# Patient Record
Sex: Male | Born: 1980 | Race: White | Hispanic: No | Marital: Married | Smoking: Former smoker
Health system: Southern US, Community
[De-identification: ages and names within clinical notes are randomized; demographics above are authoritative.]

## PROBLEM LIST (undated history)

## (undated) DIAGNOSIS — J45909 Unspecified asthma, uncomplicated: Secondary | ICD-10-CM

## (undated) DIAGNOSIS — T7840XA Allergy, unspecified, initial encounter: Secondary | ICD-10-CM

## (undated) HISTORY — PX: WRIST SURGERY: SHX841

## (undated) HISTORY — DX: Allergy, unspecified, initial encounter: T78.40XA

## (undated) HISTORY — PX: COLONOSCOPY: SHX174

## (undated) HISTORY — PX: UPPER GASTROINTESTINAL ENDOSCOPY: SHX188

## (undated) HISTORY — PX: SHOULDER SURGERY: SHX246

## (undated) HISTORY — DX: Unspecified asthma, uncomplicated: J45.909

---

## 2000-05-20 ENCOUNTER — Encounter: Admission: RE | Admit: 2000-05-20 | Discharge: 2000-05-20 | Payer: Self-pay

## 2007-06-26 ENCOUNTER — Ambulatory Visit (HOSPITAL_COMMUNITY): Admission: RE | Admit: 2007-06-26 | Discharge: 2007-06-26 | Payer: Self-pay | Admitting: Emergency Medicine

## 2007-06-26 ENCOUNTER — Emergency Department (HOSPITAL_COMMUNITY): Admission: EM | Admit: 2007-06-26 | Discharge: 2007-06-26 | Payer: Self-pay | Admitting: Emergency Medicine

## 2008-06-30 IMAGING — CT CT PELVIS W/O CM
2 of 4 series · 17 of 46 positions shown, 19 images · IV contrast (agent unspecified)
Comparison: none

CLINICAL DATA: Left flank pain. History of stones.
 ABDOMEN CT WITHOUT CONTRAST:
TECHNIQUE: Multidetector CT imaging of the abdomen was performed following the standard protocol without IV contrast.
TECHNIQUE: Multidetector CT imaging of the pelvis was performed following the standard protocol without IV contrast.

[Series 2: >200 +reformat 5.0 b31f st · axial · 0.69mm/px · z∈[-522,-88]mm · 14 of 95 slices shown, 16 images]
[im 4/95  soft-tissue]
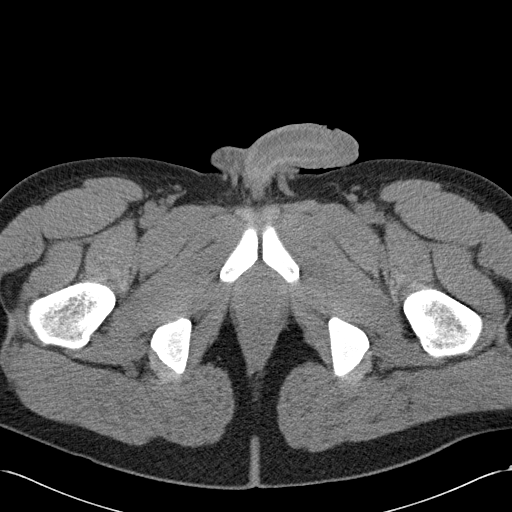
[im 4/95  bone]
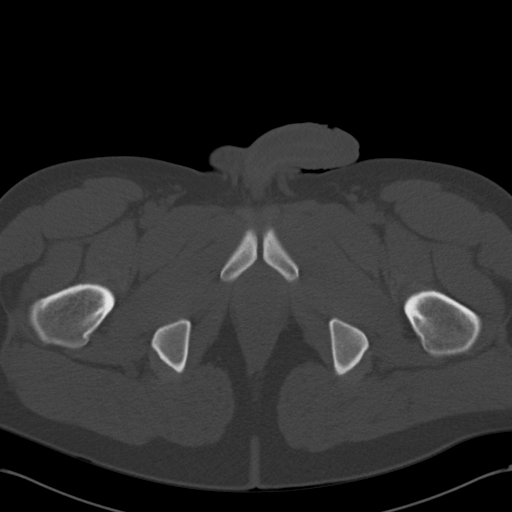
[im 12/95  soft-tissue]
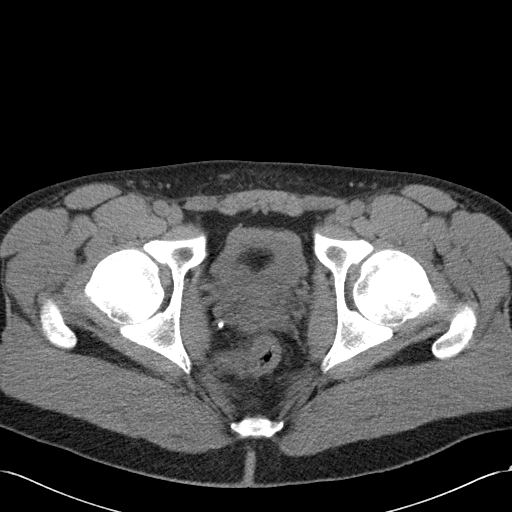
[im 19/95  soft-tissue]
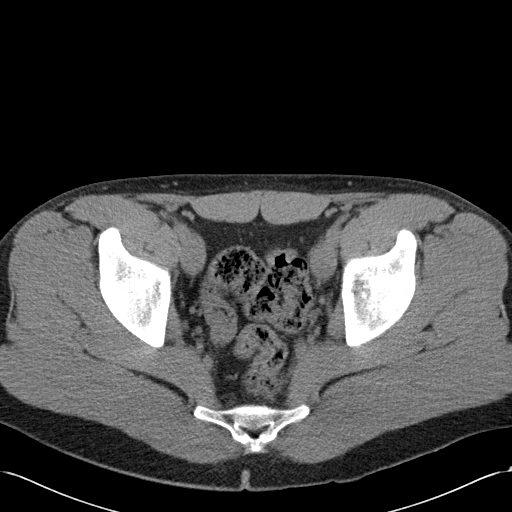
[im 27/95  soft-tissue]
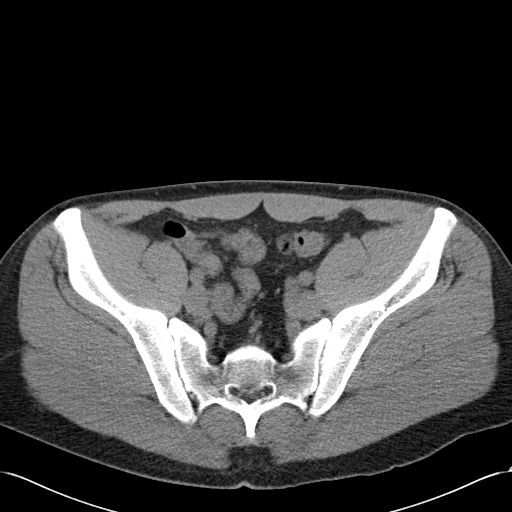
[im 31/95  soft-tissue]
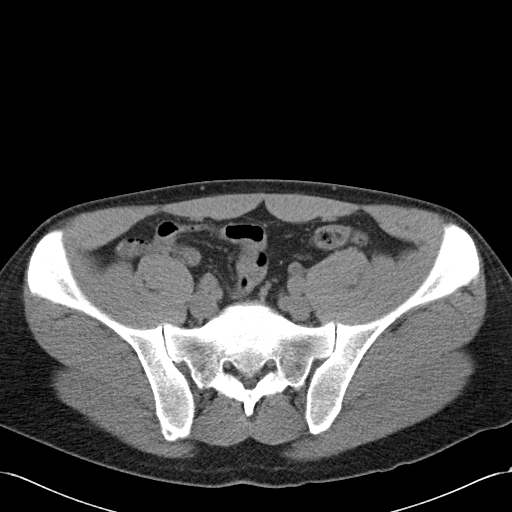
[im 38/95  soft-tissue]
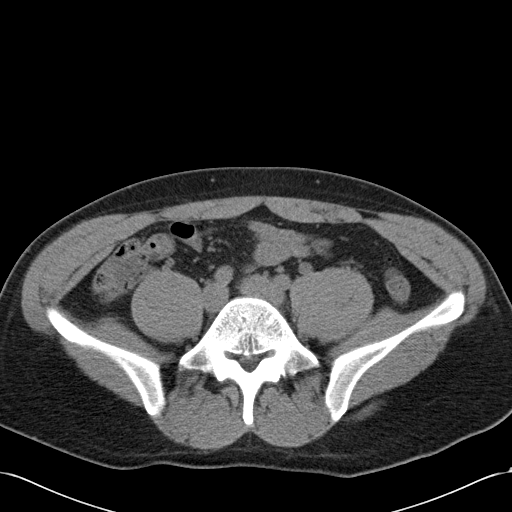
[im 46/95  soft-tissue]
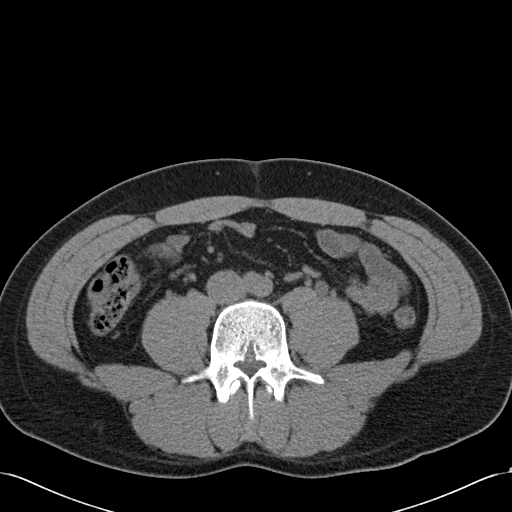
[im 49/95  soft-tissue]
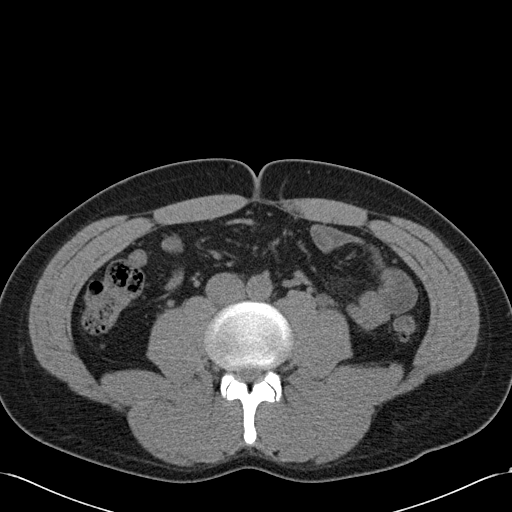
[im 57/95  soft-tissue]
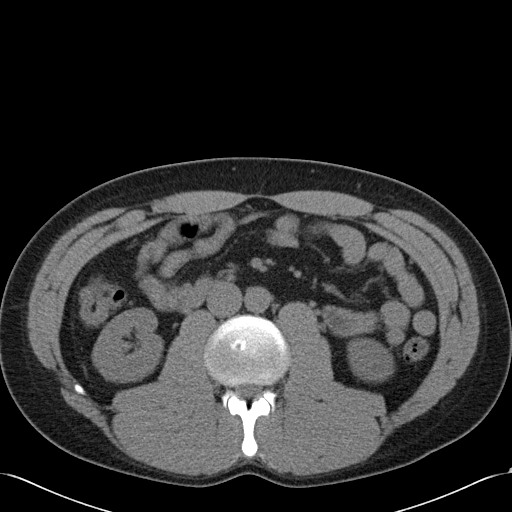
[im 57/95  bone]
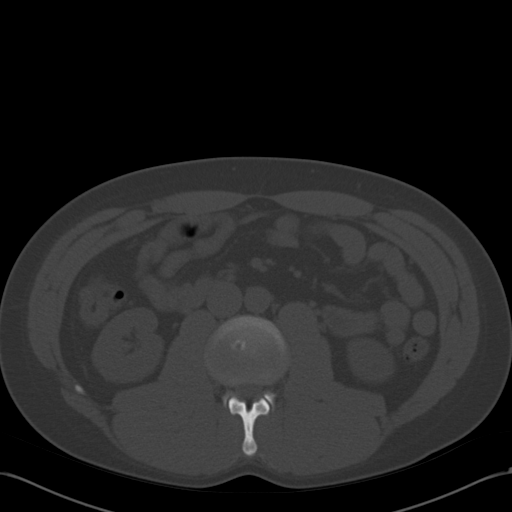
[im 64/95  soft-tissue]
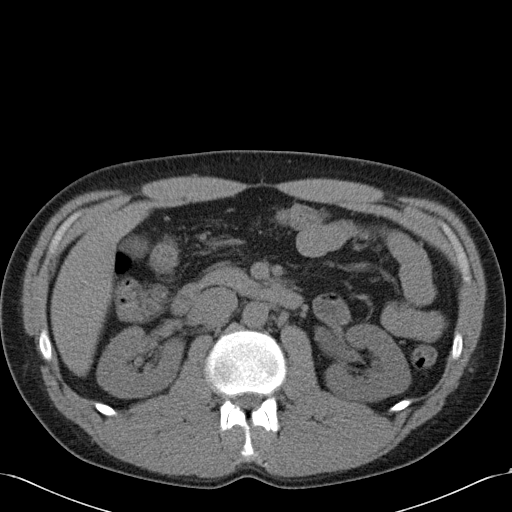
[im 72/95  soft-tissue]
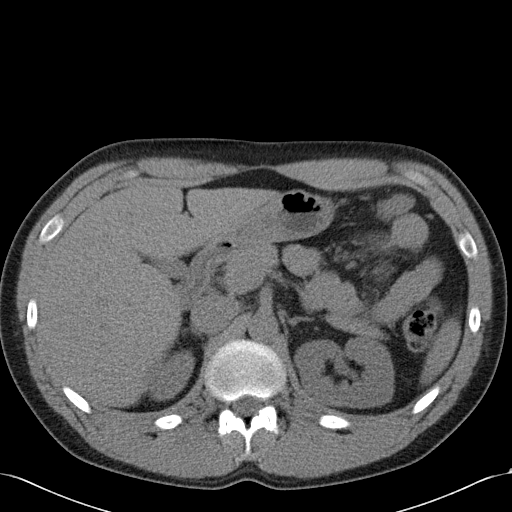
[im 76/95  soft-tissue]
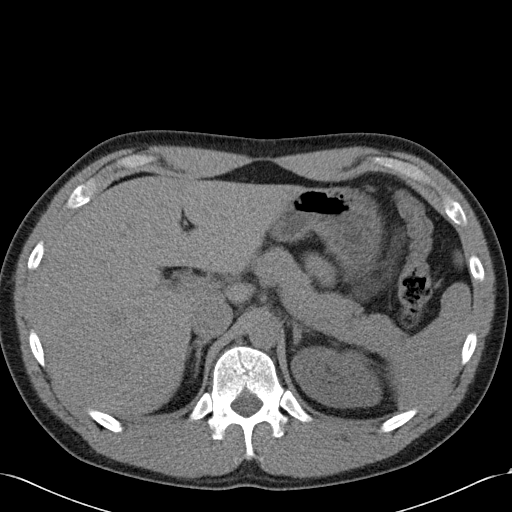
[im 83/95  soft-tissue]
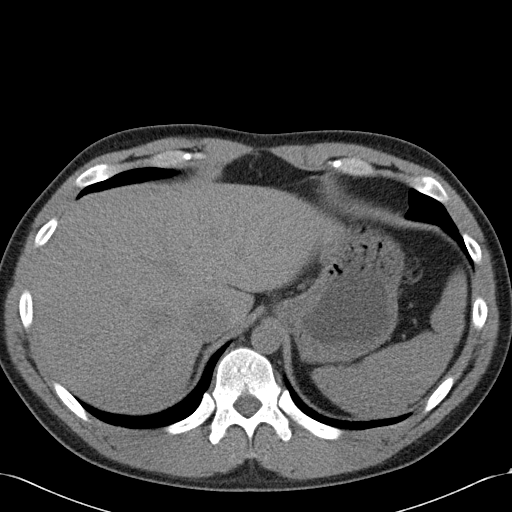
[im 91/95  soft-tissue]
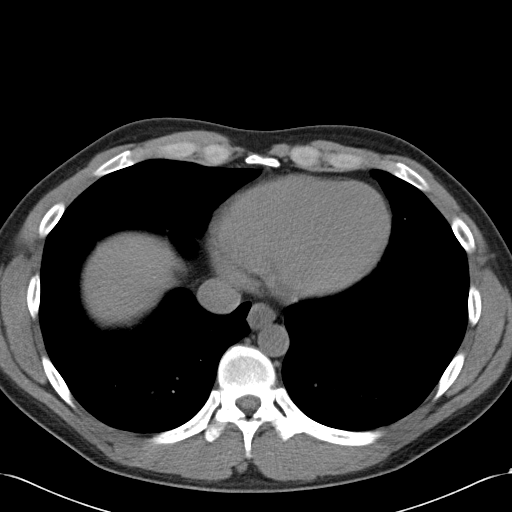

[Series 5: >200 +reformat 2.0 spo cor st · coronal · 0.92mm/px · 3 of 113 slices shown]
[im 38/113  soft-tissue]
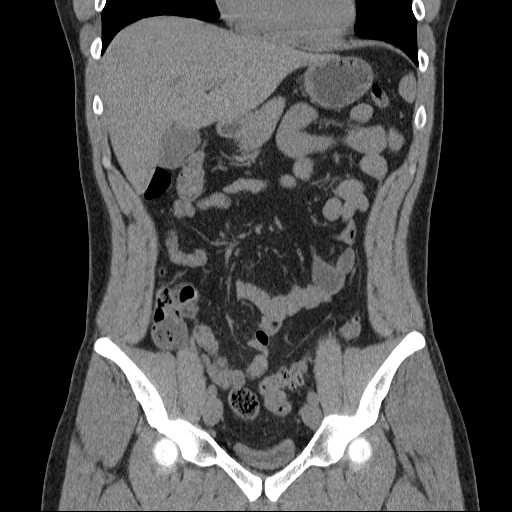
[im 50/113  soft-tissue]
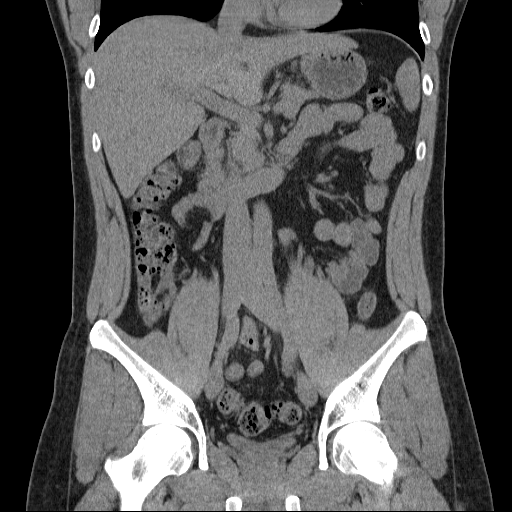
[im 63/113  soft-tissue]
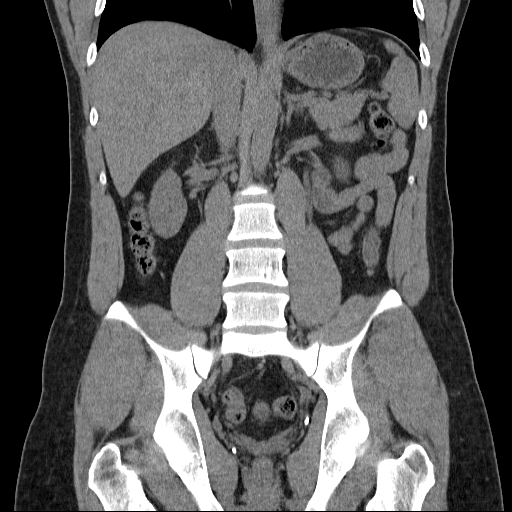

[17 of 46 positions shown; findings below may reference images not displayed]

FINDINGS: The lung bases are clear. No renal calculi are seen. There is, however, fullness of the left pelvicaliceal system and left ureter and CT of the pelvis will be performed. 
 The remainder of the study shows the liver to appear normal on this unenhanced study. No calcified gallstones are seen. The pancreas is normal and the pancreatic duct is not dilated. The adrenal glands and spleen appear normal. The abdominal aorta is normal in caliber.
IMPRESSION: 1.  Mild left hydronephrosis and hydroureter. CT of the pelvis to be performed.
 2.  No renal calculi are noted.
 PELVIS CT WITHOUT CONTRAST:
FINDINGS: The left ureter remains dilated to a point just proximal to the left UV junction where there is obstruction by a 5 x 2 mm distal left ureteral calculus. The urinary bladder is decompressed. The right ureter is normal. No fluid is seen within the pelvis. The appendix is well seen and appears normal.
IMPRESSION: Mild left hydronephrosis and hydroureter caused by 5 x 2 mm distal left ureteral calculus just proximal to the left UV junction.

## 2011-07-26 LAB — POCT URINALYSIS DIP (DEVICE)
Glucose, UA: NEGATIVE
Ketones, ur: 15 — AB
Nitrite: NEGATIVE
Operator id: 247071
Protein, ur: 30 — AB
Specific Gravity, Urine: 1.02
Urobilinogen, UA: 0.2
pH: 6.5

## 2011-12-02 ENCOUNTER — Encounter: Payer: Self-pay | Admitting: Medical

## 2017-01-01 ENCOUNTER — Encounter: Payer: Self-pay | Admitting: Allergy and Immunology

## 2017-01-01 ENCOUNTER — Ambulatory Visit (INDEPENDENT_AMBULATORY_CARE_PROVIDER_SITE_OTHER): Payer: BC Managed Care – PPO | Admitting: Allergy and Immunology

## 2017-01-01 VITALS — BP 140/90 | HR 60 | Temp 98.2°F | Resp 16 | Ht 73.6 in | Wt 261.0 lb

## 2017-01-01 DIAGNOSIS — T782XXA Anaphylactic shock, unspecified, initial encounter: Secondary | ICD-10-CM

## 2017-01-01 NOTE — Patient Instructions (Addendum)
  1. Allergen avoidance measures  2. Blood - alpha gal, CBC w/diff   3. EpiPen, Benadryl, M.D./ER for allergic reaction  4. Will require further evaluation if recurrent episodes.

## 2017-01-01 NOTE — Progress Notes (Signed)
Dear Dr. Manson PasseyBrown,  Thank you for referring Kevin Malone to [facility] on 01/01/2017.   Below is a summation of this patient's evaluation and recommendations.  Thank you for your referral. I will keep you informed about this patient's response to treatment.   If you have any questions please do not hesitate to contact me.   Sincerely,  Kevin PriestEric J. Aneth Schlagel, MD Allergy / Immunology [facility]   ______________________________________________________________________    NEW PATIENT NOTE  Referring Provider: Gillis Malone, Robert, MD Primary Provider: No PCP Per Patient Date of office visit: 01/01/2017    Subjective:   Chief Complaint:  Kevin Malone (DOB: 1981/06/20) is a 36 y.o. male who presents to the clinic on 01/01/2017 with a chief complaint of Allergic Reaction (Hives-itching-throat felt tight) .     HPI:  Kevin Malone presents to this clinic in evaluation of an allergic reaction that occurred 7 days ago. He woke up around 6 AM in the morning with stomach upset described as diarrhea and some cramping. About 8:00 or so he vomited. Around the same point in time he developed intense itching and he took 2 Benadryl and then developed diffuse urticaria and throat tightness. He went to the urgent care center where uvular swelling was identified and he was treated with epinephrine and steroids and Benadryl. After therapy was better within 2 hours and all his symptoms have basically abated sometime in the afternoon of that day.  There was no obvious provoking factor giving rise to this reaction. He did not take any unusual medications or have any unusual environmental exposure. He did not have a fever and he did not have any type of infectious disease within 2 weeks of this reaction. He did consume spaghetti with meat sauce the previous night around 6 PM. He has been mammal consumption free since that  event.  Past Medical History:  Diagnosis Date  . Allergic reaction     Past Surgical History:  Procedure Laterality Date  . SHOULDER SURGERY Left     Allergies as of 01/01/2017   No Known Allergies     Medication List      EPIPEN 2-PAK 0.3 mg/0.3 mL Soaj injection Generic drug:  EPINEPHrine See admin instructions.   predniSONE 10 MG (48) Tbpk tablet Commonly known as:  STERAPRED UNI-PAK 48 TAB TAKE AS DIRECTED FOR 12 DAYS   ranitidine 150 MG tablet Commonly known as:  ZANTAC Take 150 mg by mouth 2 (two) times daily.       Review of systems negative except as noted in HPI / PMHx or noted below:  Review of Systems  Constitutional: Negative.   HENT: Negative.   Eyes: Negative.   Respiratory: Negative.   Cardiovascular: Negative.   Gastrointestinal: Negative.   Genitourinary: Negative.   Musculoskeletal: Negative.   Skin: Negative.   Neurological: Negative.   Endo/Heme/Allergies: Negative.        Early childhood allergic rhinitis not requiring therapy in years  Psychiatric/Behavioral: Negative.     Family History  Problem Relation Age of Onset  . Hypertension Mother   . Hypertension Father     Social History   Social History  . Marital status: Single    Spouse name: N/A  . Number of children: N/A  . Years of education: N/A   Occupational History  . Not on file.   Social History Main Topics  . Smoking status:  Former Smoker  . Smokeless tobacco: Current User    Types: Chew  . Alcohol use Not on file  . Drug use: Unknown  . Sexual activity: Not on file   Other Topics Concern  . Not on file   Social History Narrative  . No narrative on file    Environmental and Social history  Lives in a house with a dry environment, a dog located inside the household, carpeting in the bedroom, no plastic on the bed or pillow, no smokers located inside the household. He is a Runner, broadcasting/film/video.  Objective:   Vitals:   01/01/17 1332  BP: 140/90  Pulse: 60   Resp: 16  Temp: 98.2 F (36.8 C)   Height: 6' 1.6" (186.9 cm) Weight: 261 lb (118.4 kg)  Physical Exam  Constitutional: He is well-developed, well-nourished, and in no distress.  HENT:  Head: Normocephalic. Head is without right periorbital erythema and without left periorbital erythema.  Right Ear: Tympanic membrane, external ear and ear canal normal.  Left Ear: Tympanic membrane, external ear and ear canal normal.  Nose: Nose normal. No mucosal edema or rhinorrhea.  Mouth/Throat: Oropharynx is clear and moist and mucous membranes are normal. No oropharyngeal exudate.  Eyes: Conjunctivae and lids are normal. Pupils are equal, round, and reactive to light.  Neck: Trachea normal. No tracheal deviation present. No thyromegaly present.  Cardiovascular: Normal rate, regular rhythm, S1 normal, S2 normal and normal heart sounds.   No murmur heard. Pulmonary/Chest: Effort normal. No stridor. No tachypnea. No respiratory distress. He has no wheezes. He has no rales. He exhibits no tenderness.  Abdominal: Soft. He exhibits no distension and no mass. There is no hepatosplenomegaly. There is no tenderness. There is no rebound and no guarding.  Musculoskeletal: He exhibits no edema or tenderness.  Lymphadenopathy:       Head (right side): No tonsillar adenopathy present.       Head (left side): No tonsillar adenopathy present.    He has no cervical adenopathy.    He has no axillary adenopathy.  Neurological: He is alert. Gait normal.  Skin: No rash noted. He is not diaphoretic. No erythema. No pallor. Nails show no clubbing.  Psychiatric: Mood and affect normal.    Diagnostics: Allergy skin tests were performed. He did not demonstrate any hypersensitivity against a screening panel of foods.  Assessment and Plan:    1. Anaphylaxis, initial encounter     1. Allergen avoidance measures  2. Blood - alpha gal, CBC w/diff   3. EpiPen, Benadryl, M.D./ER for allergic reaction  4. Will  require further evaluation if recurrent episodes.  The etiology of [name]' episode of anaphylaxis last week is unknown. We will rule out alpha gal syndrome with the blood tests mentioned above and consider further evaluation for other etiologic agents if he has recurrent reactions as he moves forward. Hopefully this was an isolated event and will not be recurrent if we cannot find the culprit responsible for this immunological hyperreactivity. He will contact me should he develop another reaction in the future. I'll contact him with the results of his blood tests when they are available for review.  Kevin Priest, MD  [facility]

## 2017-01-07 LAB — ALPHA-GAL PANEL
Alpha Gal IgE*: 8.1 kU/L — ABNORMAL HIGH (ref <0.35)
BEEF (BOS SPP) IGE: 2.26 kU/L — AB (ref <0.35)
BEEF CLASS INTERPRETATION: 2
LAMB/MUTTON (OVIS SPP) IGE: 0.34 kU/L (ref <0.35)
PORK CLASS INTERPRETATION: 2
Pork (Sus spp) IgE: 1.36 kU/L — ABNORMAL HIGH (ref <0.35)

## 2017-01-07 LAB — CBC WITH DIFFERENTIAL/PLATELET
BASOS: 0 %
Basophils Absolute: 0 10*3/uL (ref 0.0–0.2)
EOS (ABSOLUTE): 0 10*3/uL (ref 0.0–0.4)
Eos: 0 %
Hematocrit: 45.6 % (ref 37.5–51.0)
Hemoglobin: 15.6 g/dL (ref 13.0–17.7)
IMMATURE GRANS (ABS): 0.1 10*3/uL (ref 0.0–0.1)
IMMATURE GRANULOCYTES: 1 %
LYMPHS: 10 %
Lymphocytes Absolute: 1.6 10*3/uL (ref 0.7–3.1)
MCH: 30.4 pg (ref 26.6–33.0)
MCHC: 34.2 g/dL (ref 31.5–35.7)
MCV: 89 fL (ref 79–97)
MONOCYTES: 7 %
Monocytes Absolute: 1.1 10*3/uL — ABNORMAL HIGH (ref 0.1–0.9)
NEUTROS ABS: 13.3 10*3/uL — AB (ref 1.4–7.0)
Neutrophils: 82 %
PLATELETS: 363 10*3/uL (ref 150–379)
RBC: 5.14 x10E6/uL (ref 4.14–5.80)
RDW: 13.7 % (ref 12.3–15.4)
WBC: 16.1 10*3/uL — ABNORMAL HIGH (ref 3.4–10.8)

## 2017-12-25 ENCOUNTER — Encounter: Payer: Self-pay | Admitting: Allergy and Immunology

## 2017-12-25 ENCOUNTER — Ambulatory Visit: Payer: BC Managed Care – PPO | Admitting: Allergy and Immunology

## 2017-12-25 VITALS — BP 110/70 | HR 88 | Resp 18

## 2017-12-25 DIAGNOSIS — T7800XD Anaphylactic reaction due to unspecified food, subsequent encounter: Secondary | ICD-10-CM

## 2017-12-25 MED ORDER — AUVI-Q 0.3 MG/0.3ML IJ SOAJ: INTRAMUSCULAR | 3 refills | Status: AC

## 2017-12-25 NOTE — Patient Instructions (Signed)
  1. Allergen avoidance measures  2. Blood - alpha gal   3. AUVI-Q 0.3 (Epi-Pen), Benadryl, M.D./ER for allergic reaction  4. Restart consuming mammal?

## 2017-12-25 NOTE — Progress Notes (Signed)
Follow-up Note  Referring Provider: No ref. provider found Primary Provider: Patient, No Pcp Per Date of Office Visit: 12/25/2017  Subjective:   Kevin Malone (DOB: April 23, 1981) is a 37 y.o. male who [facility] on 12/25/2017 in re-evaluation of the following:  HPI: Kevin Malone returns to this clinic in evaluation of his alpha gal syndrome.  His last visit to this clinic was 01 January 2017 at which point in time we diagnosed him with sensitivity to mammal.  He has been mammal free and has not had any reactions.  He is very interested in restarting consumption of mammal.  Allergies as of 12/25/2017   No Known Allergies     Medication List      EPIPEN 2-PAK 0.3 mg/0.3 mL Soaj injection Generic drug:  EPINEPHrine See admin instructions.       Past Medical History:  Diagnosis Date  . Allergic reaction     Past Surgical History:  Procedure Laterality Date  . SHOULDER SURGERY Left     Review of systems negative except as noted in HPI / PMHx or noted below:  Review of Systems  Constitutional: Negative.   HENT: Negative.   Eyes: Negative.   Respiratory: Negative.   Cardiovascular: Negative.   Gastrointestinal: Negative.   Genitourinary: Negative.   Musculoskeletal: Negative.   Skin: Negative.   Neurological: Negative.   Endo/Heme/Allergies: Negative.   Psychiatric/Behavioral: Negative.      Objective:   Vitals:   12/25/17 1028  BP: 110/70  Pulse: 88  Resp: 18          Physical Exam  Constitutional: He is well-developed, well-nourished, and in no distress.  HENT:  Head: Normocephalic.  Right Ear: Tympanic membrane, external ear and ear canal normal.  Left Ear: Tympanic membrane, external ear and ear canal normal.  Nose: Nose normal. No mucosal edema or rhinorrhea.  Mouth/Throat: Uvula is midline, oropharynx is clear and moist and mucous membranes are normal. No oropharyngeal exudate.  Eyes: Conjunctivae are normal.  Neck:  Trachea normal. No tracheal tenderness present. No tracheal deviation present. No thyromegaly present.  Cardiovascular: Normal rate, regular rhythm, S1 normal, S2 normal and normal heart sounds.  No murmur heard. Pulmonary/Chest: Breath sounds normal. No stridor. No respiratory distress. He has no wheezes. He has no rales.  Musculoskeletal: He exhibits no edema.  Lymphadenopathy:       Head (right side): No tonsillar adenopathy present.       Head (left side): No tonsillar adenopathy present.    He has no cervical adenopathy.  Neurological: He is alert. Gait normal.  Skin: No rash noted. He is not diaphoretic. No erythema. Nails show no clubbing.  Psychiatric: Mood and affect normal.    Diagnostics:    Results of blood tests obtained 01 January 2017 identified an alpha gal IgE titer of 8.0 KU/L, beef IgE 2.26 KU/L, pork 1.36 KU/L, lamb 0.34 KU/L.  WBC was 16.1, absolute eosinophils 0, absolute lymphocyte 1600, hemoglobin 15.6, platelet 363.  Assessment and Plan:   1. Anaphylactic shock due to food, subsequent encounter     1. Allergen avoidance measures  2. Blood - alpha gal   3. AUVI-Q 0.3 (Epi-Pen), Benadryl, M.D./ER for allergic reaction  4. Restart consuming mammal?  Kevin Malone appears to be doing quite well while he is mammal consumption free.  We will now see if there is an opportunity for him to restart consumption of mammal by rechecking his alpha gal panel.  I will contact him with the results of that test once it is available for review.  Laurette Schimke, MD Allergy / Immunology [facility]

## 2017-12-29 ENCOUNTER — Encounter: Payer: Self-pay | Admitting: Allergy and Immunology

## 2017-12-30 LAB — ALPHA-GAL PANEL
ALPHA GAL IGE: 2.61 kU/L — AB (ref <0.10)
BEEF (BOS SPP) IGE: 1.03 kU/L — AB (ref <0.35)
Class Interpretation: 1
Class Interpretation: 2
Lamb/Mutton (Ovis spp) IgE: 0.16 kU/L (ref <0.35)
Pork (Sus spp) IgE: 0.4 kU/L — ABNORMAL HIGH (ref <0.35)

## 2018-11-25 ENCOUNTER — Telehealth: Payer: Self-pay | Admitting: Allergy and Immunology

## 2018-11-25 ENCOUNTER — Other Ambulatory Visit: Payer: Self-pay | Admitting: *Deleted

## 2018-11-25 DIAGNOSIS — T7800XD Anaphylactic reaction due to unspecified food, subsequent encounter: Secondary | ICD-10-CM

## 2018-11-25 NOTE — Telephone Encounter (Signed)
Patient has upcoming yearly appt for alpha-gal Patient wants to know where he should go for testing prior to appt Please call

## 2018-11-25 NOTE — Telephone Encounter (Signed)
Please obtain alpha gal panel

## 2018-11-25 NOTE — Telephone Encounter (Signed)
Do you want [place] to wait to order labs until his appt or do prior to him coming in?

## 2018-11-25 NOTE — Telephone Encounter (Signed)
Spoke with [name], I will put orders in and he will go to [facility].

## 2018-12-03 ENCOUNTER — Ambulatory Visit: Payer: BC Managed Care – PPO | Admitting: Allergy and Immunology

## 2018-12-03 ENCOUNTER — Encounter: Payer: Self-pay | Admitting: Allergy and Immunology

## 2018-12-03 VITALS — BP 118/80 | HR 84 | Resp 18

## 2018-12-03 DIAGNOSIS — T7800XD Anaphylactic reaction due to unspecified food, subsequent encounter: Secondary | ICD-10-CM

## 2018-12-03 LAB — ALPHA-GAL PANEL
Alpha Gal IgE*: 8.74 kU/L — ABNORMAL HIGH (ref <0.10)
BEEF CLASS INTERPRETATION: 2
Beef (Bos spp) IgE: 3.15 kU/L — ABNORMAL HIGH (ref <0.35)
Lamb/Mutton (Ovis spp) IgE: 0.29 kU/L (ref <0.35)
PORK (SUS SPP) IGE: 1.19 kU/L — AB (ref <0.35)
PORK CLASS INTERPRETATION: 2

## 2018-12-03 NOTE — Patient Instructions (Addendum)
  1. Allergen avoidance measures  2. AUVI-Q 0.3, Benadryl, M.D./ER for allergic reaction  3.  Return to clinic in 1 year or earlier if problem

## 2018-12-03 NOTE — Progress Notes (Signed)
Follow-up Note  Referring Provider: No ref. provider found Primary Provider: Patient, No Pcp Per Date of Office Visit: 12/03/2018  Subjective:   Kevin Malone (DOB: 1981/02/25) is a 38 y.o. male who [facility] on 12/03/2018 in re-evaluation of the following:  HPI: Kevin Malone returns to this clinic in reevaluation of alpha gal syndrome.  I last saw him in his clinic on 25 December 2017.  He has remained mammal free and has not had any allergic reactions.  He does have an injectable epinephrine device.  Allergies as of 12/03/2018   No Known Allergies     Medication List      AUVI-Q 0.3 mg/0.3 mL Soaj injection Generic drug:  EPINEPHrine Use as directed for life threatening allergic reactions       Past Medical History:  Diagnosis Date  . Allergic reaction     Past Surgical History:  Procedure Laterality Date  . SHOULDER SURGERY Left     Review of systems negative except as noted in HPI / PMHx or noted below:  Review of Systems  Constitutional: Negative.   HENT: Negative.   Eyes: Negative.   Respiratory: Negative.   Cardiovascular: Negative.   Gastrointestinal: Negative.   Genitourinary: Negative.   Musculoskeletal: Negative.   Skin: Negative.   Neurological: Negative.   Endo/Heme/Allergies: Negative.   Psychiatric/Behavioral: Negative.      Objective:   Vitals:   12/03/18 1116  BP: 118/80  Pulse: 84  Resp: 18  SpO2: 98%          Physical Exam Constitutional:      Appearance: He is not diaphoretic.  HENT:     Head: Normocephalic.     Right Ear: Tympanic membrane, ear canal and external ear normal.     Left Ear: Tympanic membrane, ear canal and external ear normal.     Nose: Nose normal. No mucosal edema or rhinorrhea.     Mouth/Throat:     Pharynx: Uvula midline. No oropharyngeal exudate.  Eyes:     Conjunctiva/sclera: Conjunctivae normal.  Neck:     Thyroid: No thyromegaly.     Trachea: Trachea normal. No  tracheal tenderness or tracheal deviation.  Cardiovascular:     Rate and Rhythm: Normal rate and regular rhythm.     Heart sounds: Normal heart sounds, S1 normal and S2 normal. No murmur.  Pulmonary:     Effort: No respiratory distress.     Breath sounds: Normal breath sounds. No stridor. No wheezing or rales.  Lymphadenopathy:     Head:     Right side of head: No tonsillar adenopathy.     Left side of head: No tonsillar adenopathy.     Cervical: No cervical adenopathy.  Skin:    Findings: No erythema or rash.     Nails: There is no clubbing.   Neurological:     Mental Status: He is alert.     Diagnostics: Results of blood tests obtained 27 November 2018 identified alpha gal IgE 8.74 KU/L, beef 3.15 KU/L, pork 1.19 KU/L  Assessment and Plan:   1. Anaphylactic shock due to food, subsequent encounter     1. Allergen avoidance measures  2. AUVI-Q 0.3, Benadryl, M.D./ER for allergic reaction  3.  Return to clinic in 1 year or earlier if problem  Jaydden appears to still have relatively high titer antibodies directed against alpha gal and I have informed him that I do not recommend that he have exposure  to a mammal meat at this point in time.  If he does well I will see him back in his clinic in 1 year or earlier if there is a problem.  It should be noted that he was not particularly happy about remaining with alpha gal syndrome and he did make a request today that he be checked every 6 months to see if it resolves this issue.  Laurette Schimke, MD Allergy / Immunology [facility]

## 2018-12-07 ENCOUNTER — Encounter: Payer: Self-pay | Admitting: Allergy and Immunology

## 2023-09-28 ENCOUNTER — Emergency Department (HOSPITAL_BASED_OUTPATIENT_CLINIC_OR_DEPARTMENT_OTHER): Payer: BC Managed Care – PPO

## 2023-09-28 ENCOUNTER — Other Ambulatory Visit: Payer: Self-pay

## 2023-09-28 ENCOUNTER — Emergency Department (HOSPITAL_BASED_OUTPATIENT_CLINIC_OR_DEPARTMENT_OTHER)
Admission: EM | Admit: 2023-09-28 | Discharge: 2023-09-28 | Disposition: A | Discharge status: Home / Self Care | Payer: BC Managed Care – PPO | Attending: Emergency Medicine | Admitting: Emergency Medicine

## 2023-09-28 DIAGNOSIS — D72829 Elevated white blood cell count, unspecified: Secondary | ICD-10-CM | POA: Diagnosis not present

## 2023-09-28 DIAGNOSIS — R1032 Left lower quadrant pain: Secondary | ICD-10-CM | POA: Diagnosis present

## 2023-09-28 DIAGNOSIS — K5792 Diverticulitis of intestine, part unspecified, without perforation or abscess without bleeding: Secondary | ICD-10-CM | POA: Insufficient documentation

## 2023-09-28 LAB — COMPREHENSIVE METABOLIC PANEL
ALT: 28 U/L (ref 0–44)
AST: 21 U/L (ref 15–41)
Albumin: 4.8 g/dL (ref 3.5–5.0)
Alkaline Phosphatase: 52 U/L (ref 38–126)
Anion gap: 8 (ref 5–15)
BUN: 14 mg/dL (ref 6–20)
CO2: 28 mmol/L (ref 22–32)
Calcium: 9.3 mg/dL (ref 8.9–10.3)
Chloride: 102 mmol/L (ref 98–111)
Creatinine, Ser: 1.16 mg/dL (ref 0.61–1.24)
GFR, Estimated: >60 mL/min (ref >60)
Glucose, Bld: 101 mg/dL — ABNORMAL HIGH (ref 70–99)
Potassium: 4 mmol/L (ref 3.5–5.1)
Sodium: 138 mmol/L (ref 135–145)
Total Bilirubin: 1.1 mg/dL (ref <1.2)
Total Protein: 7.4 g/dL (ref 6.5–8.1)

## 2023-09-28 LAB — URINALYSIS, ROUTINE W REFLEX MICROSCOPIC
Bacteria, UA: NONE SEEN
Bilirubin Urine: NEGATIVE
Glucose, UA: NEGATIVE mg/dL
Ketones, ur: 40 mg/dL — AB
Leukocytes,Ua: NEGATIVE
Nitrite: NEGATIVE
Specific Gravity, Urine: 1.031 — ABNORMAL HIGH (ref 1.005–1.030)
pH: 6.5 (ref 5.0–8.0)

## 2023-09-28 LAB — LIPASE, BLOOD: Lipase: 10 U/L — ABNORMAL LOW (ref 11–51)

## 2023-09-28 LAB — CBC
HCT: 42.5 % (ref 39.0–52.0)
Hemoglobin: 14.5 g/dL (ref 13.0–17.0)
MCH: 31.6 pg (ref 26.0–34.0)
MCHC: 34.1 g/dL (ref 30.0–36.0)
MCV: 92.6 fL (ref 80.0–100.0)
Platelets: 313 10*3/uL (ref 150–400)
RBC: 4.59 MIL/uL (ref 4.22–5.81)
RDW: 12.7 % (ref 11.5–15.5)
WBC: 12.3 10*3/uL — ABNORMAL HIGH (ref 4.0–10.5)
nRBC: 0 % (ref 0.0–0.2)

## 2023-09-28 MED ORDER — MORPHINE SULFATE (PF) 4 MG/ML IV SOLN
4.0000 mg | Freq: Once | INTRAVENOUS | Status: AC
  Administered 2023-09-28: 4 mg via INTRAVENOUS
  Filled 2023-09-28: qty 1

## 2023-09-28 MED ORDER — METRONIDAZOLE 500 MG PO TABS
500.0000 mg | ORAL_TABLET | Freq: Three times a day (TID) | ORAL | 0 refills | Status: DC
Start: 2023-09-28 — End: 2024-05-27

## 2023-09-28 MED ORDER — CIPROFLOXACIN HCL 500 MG PO TABS
500.0000 mg | ORAL_TABLET | Freq: Once | ORAL | Status: AC
  Administered 2023-09-28: 500 mg via ORAL
  Filled 2023-09-28: qty 1

## 2023-09-28 MED ORDER — OXYCODONE-ACETAMINOPHEN 5-325 MG PO TABS: 1.0000 | ORAL_TABLET | Freq: Four times a day (QID) | ORAL | 0 refills | Status: DC | PRN

## 2023-09-28 MED ORDER — OXYCODONE-ACETAMINOPHEN 5-325 MG PO TABS
2.0000 | ORAL_TABLET | Freq: Once | ORAL | Status: AC
Start: 2023-09-28 — End: 2023-09-28
  Administered 2023-09-28: 2 via ORAL
  Filled 2023-09-28: qty 2

## 2023-09-28 MED ORDER — METRONIDAZOLE 500 MG PO TABS
500.0000 mg | ORAL_TABLET | Freq: Once | ORAL | Status: AC
  Administered 2023-09-28: 500 mg via ORAL
  Filled 2023-09-28: qty 1

## 2023-09-28 MED ORDER — CIPROFLOXACIN HCL 500 MG PO TABS: 500.0000 mg | ORAL_TABLET | Freq: Two times a day (BID) | ORAL | 0 refills | Status: DC

## 2023-09-28 MED ORDER — IOHEXOL 300 MG/ML  SOLN
100.0000 mL | Freq: Once | INTRAMUSCULAR | Status: AC | PRN
  Administered 2023-09-28: 100 mL via INTRAVENOUS

## 2023-09-28 NOTE — Discharge Instructions (Addendum)
Begin taking Cipro and Flagyl as prescribed.  Begin taking Percocet as prescribed as needed for pain.  Follow-up with your primary doctor in the next 1 to 2 weeks to discuss GI referral.  Return to the ER if you develop worsening pain, high fevers, rectal bleeding, or for other new and concerning symptoms.

## 2023-09-28 NOTE — ED Triage Notes (Signed)
Pt POV from home reporting bilateral upper abd pain, tender to touch, hx diverticulitis, seen at [facility] and told to come to ED for further evaluation.

## 2023-09-28 NOTE — ED Provider Notes (Signed)
[place] EMERGENCY DEPARTMENT AT [facility] Provider Note   CSN: [id number] Arrival date & time: 09/28/23  1711     History  Chief Complaint  Patient presents with   Abdominal Pain    Kevin Malone is a 42 y.o. male.  Patient is a 42 year old male with no significant past medical history.  Patient presenting today with complaints of abdominal pain.  He describes a 1 week history of worsening left lower quadrant pain in the absence of any fevers.  He denies any bloody stools.  No aggravating or alleviating factors.  Patient initially went to urgent care, then was sent here for CT scan to rule out bowel obstruction versus diverticulitis.  The history is provided by the patient.       Home Medications Prior to Admission medications   Medication Sig Start Date End Date Taking? Authorizing Provider  AUVI-Q 0.3 MG/0.3ML SOAJ injection Use as directed for life threatening allergic reactions 12/25/17   Kozlow, Alvira Philips, MD      Allergies    Patient has no known allergies.    Review of Systems   Review of Systems  All other systems reviewed and are negative.   Physical Exam Updated Vital Signs BP 123/86   Pulse 90   Temp 98.9 F (37.2 C) (Oral)   Resp 17   Ht 6\' 2"  (1.88 m)   Wt 136.1 kg   SpO2 98%   BMI 38.52 kg/m  Physical Exam Vitals and nursing note reviewed.  Constitutional:      General: He is not in acute distress.    Appearance: He is well-developed. He is not diaphoretic.  HENT:     Head: Normocephalic and atraumatic.  Cardiovascular:     Rate and Rhythm: Normal rate and regular rhythm.     Heart sounds: No murmur heard.    No friction rub.  Pulmonary:     Effort: Pulmonary effort is normal. No respiratory distress.     Breath sounds: Normal breath sounds. No wheezing or rales.  Abdominal:     General: Bowel sounds are normal. There is no distension.     Palpations: Abdomen is soft.     Tenderness: There is abdominal tenderness in the left  lower quadrant. There is no right CVA tenderness, left CVA tenderness, guarding or rebound.  Musculoskeletal:        General: Normal range of motion.     Cervical back: Normal range of motion and neck supple.  Skin:    General: Skin is warm and dry.  Neurological:     Mental Status: He is alert and oriented to person, place, and time.     Coordination: Coordination normal.     ED Results / Procedures / Treatments   Labs (all labs ordered are listed, but only abnormal results are displayed) Labs Reviewed  LIPASE, BLOOD - Abnormal; Notable for the following components:      Result Value   Lipase 10 (*)    All other components within normal limits  COMPREHENSIVE METABOLIC PANEL - Abnormal; Notable for the following components:   Glucose, Bld 101 (*)    All other components within normal limits  CBC - Abnormal; Notable for the following components:   WBC 12.3 (*)    All other components within normal limits  URINALYSIS, ROUTINE W REFLEX MICROSCOPIC - Abnormal; Notable for the following components:   Specific Gravity, Urine 1.031 (*)    Hgb urine dipstick TRACE (*)  Ketones, ur 40 (*)    Protein, ur TRACE (*)    All other components within normal limits    EKG None  Radiology CT ABDOMEN W CONTRAST Result Date: 09/28/2023 CLINICAL DATA:  Abdominal pain, acute, nonlocalized EXAM: CT ABDOMEN WITH CONTRAST TECHNIQUE: Multidetector CT imaging of the abdomen was performed using the standard protocol following bolus administration of intravenous contrast. RADIATION DOSE REDUCTION: This exam was performed according to the departmental dose-optimization program which includes automated exposure control, adjustment of the mA and/or kV according to patient size and/or use of iterative reconstruction technique. CONTRAST:  OMNIPAQUE IOHEXOL 300 MG/ML  SOLN COMPARISON:  06/26/2007 FINDINGS: Lower chest: No acute abnormality. Hepatobiliary: No focal liver abnormality is seen. No  gallstones, gallbladder wall thickening, or biliary dilatation. Pancreas: Unremarkable. No pancreatic ductal dilatation or surrounding inflammatory changes. Spleen: Normal in size without focal abnormality. Adrenals/Urinary Tract: Unremarkable adrenal glands. Kidneys enhance symmetrically without focal lesion, stone, or hydronephrosis. Ureters are nondilated. Urinary bladder appears unremarkable for the degree of distention. Stomach/Bowel: Stomach within normal limits. No dilated loops of bowel. Normal appendix in the right lower quadrant. Diverticulosis throughout the colon. At least 2 focally inflamed diverticula, one at the distal descending colon and one within the sigmoid colon (series 2, images 74 and 86). Associated pericolonic fat stranding and trace fluid. No organized pericolonic fluid collection. No extraluminal air. Vascular/Lymphatic: No significant vascular findings are present. No enlarged abdominal or pelvic lymph nodes. Other: No ascites or pneumoperitoneum. Musculoskeletal: No acute or significant osseous findings. IMPRESSION: Acute uncomplicated diverticulitis of the distal descending and sigmoid colon. Electronically Signed   By: Duanne Guess D.O.   On: 09/28/2023 18:50    Procedures Procedures    Medications Ordered in ED Medications  morphine (PF) 4 MG/ML injection 4 mg (has no administration in time range)  ciprofloxacin (CIPRO) tablet 500 mg (has no administration in time range)  metroNIDAZOLE (FLAGYL) tablet 500 mg (has no administration in time range)  oxyCODONE-acetaminophen (PERCOCET/ROXICET) 5-325 MG per tablet 2 tablet (has no administration in time range)  iohexol (OMNIPAQUE) 300 MG/ML solution 100 mL (100 mLs Intravenous Contrast Given 09/28/23 1834)    ED Course/ Medical Decision Making/ A&P  Patient presenting with abdominal pain as described in the HPI.  He arrives here with stable vital signs and is afebrile.  Physical examination reveals tenderness in the  left lower quadrant, but is otherwise unremarkable.  Laboratory studies obtained including CBC, CMP, and lipase.  CBC shows a white count of 12.3, but laboratory studies otherwise unremarkable.  CT scan of the abdomen and pelvis shows acute uncomplicated diverticulitis.  Patient given Cipro and Flagyl along with morphine and Percocet for pain.  He will be discharged with Abrol and Flagyl and Percocet.  Patient to follow-up with primary doctor and return if symptoms worsen.  Final Clinical Impression(s) / ED Diagnoses Final diagnoses:  None    Rx / DC Orders ED Discharge Orders     None         Geoffery Lyons, MD 09/28/23 [id number]

## 2023-09-29 NOTE — ED Notes (Signed)
Pt c/o bilat upper abdominal pain, tender to touch.  States he has not been able to eat or drink much today.   Has IV in place  Ct scan shows diverticulitis

## 2024-05-24 ENCOUNTER — Encounter: Payer: Self-pay | Admitting: Physician Assistant

## 2024-05-26 NOTE — Progress Notes (Signed)
 05/27/2024 Kevin Malone [facility] [id number] 1981-03-30  Referring provider: No ref. provider found Primary GI doctor: Dr. San  ASSESSMENT AND PLAN:  Acute diverticulitis Potentially had another episode 2 years prior treated at [facility] without imaging 09/28/2023 CT AP W in ER for abdominal pain showed Acute uncomplicated diverticulitis of the distal descending and sigmoid colon.  Treated with Cipro  Flagyl .  Patient had associated leukocytosis normal kidney liver. Symptoms improved Kevin family history of colon cancer, autoimmune BM daily or 2 x a day, formed stools, rare BRB on TP and outside of stool, last saw weeks ago.  Admits to poor diet, busy with work, has LLQ pain with salads Kevin ongoing symptoms related to diverticulitis.  -Reviewed recommendations to follow a high fiber diet or to use fiber supplements on a regular basis.   -Using NSAIDs may be associated with a moderately increased risk of occurrence of any episode of diverticulitis and complicated diverticulitis.  -Will schedule for colonoscopy to evaluate and rule out malignancy/autoimmune, We have discussed the risks of bleeding, infection, perforation, medication reactions, and remote risk of death associated with colonoscopy. All questions were answered and the patient acknowledges these risk and wishes to proceed.  Dysphagia for years, denies GERD, has more mucus, hoarseness Asthma as a kid, allergies, but eczema Upper esophagus symptoms with regurgitation with water Worsening symptoms 2 years ago Kevin NSAIDS, rare ETOH 1-2 x a week has decreased amount, dips and vapes x 14 years Has had intentional weight loss Possible EOE, LPR, stenosis, gastritis, with tobacco use rule out cancer, Kevin lymphadenopathy -Schedule EGD with dilatation to evaluate for stenosis, tumor, erosive/infectious esophagititis, and EOE. I discussed risks of EGD with patient today, including risk of sedation, bleeding or perforation.  Patient provides  understanding and gave verbal consent to proceed. - consider barium swallow -In the interim patient advised about swallowing precautions.  -Eat slowly, chew food well before swallowing.  -Drink liquids in between each bite to avoid food impaction. - ER precautions discussed with the patient  Patient Care Team: Patient, Kevin Malone as PCP - General (General Practice)  HISTORY OF PRESENT ILLNESS: 43 y.o. male with a past medical history listed below presents as a new patient for evaluation of acute diverticulitis.   Discussed the use of AI scribe software for clinical note transcription with the patient, who gave verbal consent to proceed.  History of Present Illness   Kevin Malone is a 43 year old male with a history of diverticulitis who presents with difficulty swallowing and recurrent diverticulitis. He is accompanied by his wife, Kevin Malone.  He experiences difficulty swallowing, describing it as food getting 'stuck' in his upper esophagus. This issue has persisted since childhood but has worsened over the past two weeks following a significant choking episode. He experiences this sensation almost daily, which is relieved by drinking water, although sometimes he needs to vomit to clear the obstruction. Kevin associated breathing difficulties during these episodes.  He has a history of diverticulitis, with the first significant episode occurring in December, requiring an emergency room visit and a CT scan. He was treated with Cipro  and Flagyl , which improved his symptoms. These episodes occur approximately every two years, with the last flare-up being in December. In between episodes, his bowel movements are typically normal, occurring once a day, sometimes multiple times, and are well-formed. He occasionally notices bright red blood on the toilet paper or stool.  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 Eating salads and certain vegetables can cause lower abdominal discomfort. He occasionally consumes alcohol, about once or twice a week, and uses tobacco in the form of dip and vape.  Kevin recent fevers, chills, or significant weight loss, although he has lost weight intentionally. He does not experience heartburn or reflux currently, but recalls vomiting bile in the mornings as a child. 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      He  reports that he has quit smoking. His smokeless tobacco use includes chew. He reports current alcohol use. He reports that he does not currently use drugs.  RELEVANT GI HISTORY, IMAGING AND LABS: Results          CBC    Component Value Date/Time   WBC 12.3 (H) 09/28/2023 1730   RBC 4.59 09/28/2023 1730   HGB 14.5 09/28/2023 1730   HGB 15.6 01/01/2017 1434   HCT 42.5 09/28/2023 1730   HCT 45.6 01/01/2017 1434   PLT 313 09/28/2023 1730   PLT 363 01/01/2017 1434   MCV 92.6 09/28/2023 1730   MCV 89 01/01/2017 1434   MCH 31.6 09/28/2023 1730   MCHC 34.1 09/28/2023 1730   RDW 12.7 09/28/2023 1730   RDW 13.7 01/01/2017 1434   LYMPHSABS 1.6 01/01/2017 1434   EOSABS 0.0 01/01/2017 1434   BASOSABS 0.0 01/01/2017 1434   Recent Labs    09/28/23 1730  HGB 14.5    CMP     Component Value Date/Time   NA 138 09/28/2023 1730   K 4.0 09/28/2023 1730   CL 102 09/28/2023 1730   CO2 28 09/28/2023 1730   GLUCOSE 101 (H) 09/28/2023 1730   BUN 14 09/28/2023 1730   CREATININE 1.16 09/28/2023 1730   CALCIUM 9.3 09/28/2023 1730   PROT 7.4 09/28/2023 1730   ALBUMIN 4.8 09/28/2023 1730   AST 21 09/28/2023 1730   ALT 28 09/28/2023 1730   ALKPHOS 52 09/28/2023 1730   BILITOT 1.1 09/28/2023 1730   GFRNONAA >60 09/28/2023 1730      Latest Ref Rng & Units 09/28/2023    5:30 PM  Hepatic Function  Total Protein 6.5 - 8.1 g/dL 7.4   Albumin 3.5 - 5.0 g/dL 4.8   AST 15 - 41 U/L 21   ALT 0 - 44  U/L 28   Alk Phosphatase 38 - 126 U/L 52   Total Bilirubin <1.2 mg/dL 1.1       Current Medications:    Current Outpatient Medications (Cardiovascular):    AUVI-Q  0.3 MG/0.3ML SOAJ injection, Use as directed for life threatening allergic reactions     Current Outpatient Medications (Other):    famotidine  (PEPCID ) 40 MG tablet, Take 1 tablet (40 mg total) by mouth at bedtime.  Medical History:  Past Medical History:  Diagnosis Date   Allergic reaction    Allergies: Kevin Known Allergies   Surgical History:  He  has a past surgical history that includes Shoulder surgery (Left) and Wrist surgery (Right). Family History:  His family history includes Hypertension in his father and mother.  REVIEW OF SYSTEMS  : All other systems reviewed and negative except where noted in the History of Present Illness.  PHYSICAL EXAM: BP 110/64 (BP Location: Left Arm, Patient Position: Sitting, Cuff Size: Large)   Pulse 75   Ht 6' 2 (1.88 m)   Wt 247 lb 2 oz (  112.1 kg)   BMI 31.73 kg/m  Physical Exam   GENERAL APPEARANCE: Well nourished, in Kevin apparent distress. HEENT: Kevin cervical lymphadenopathy, unremarkable thyroid, sclerae anicteric, conjunctiva pink. RESPIRATORY: Respiratory effort normal, BS equal bilateral without rales, rhonchi, wheezing. CARDIO: RRR with Kevin MRGs, peripheral pulses intact. ABDOMEN: Soft, non distended, active bowel sounds in all 4 quadrants, Kevin tenderness to palpation, Kevin rebound, Kevin mass appreciated. RECTAL: Kevin anal fissures or hemorrhoids, Kevin rectal masses, hemoccult negative. MUSCULOSKELETAL: Full ROM, normal gait, without edema. SKIN: Dry, intact without rashes or lesions. Kevin jaundice. NEURO: Alert, oriented, Kevin focal deficits. PSYCH: Cooperative, normal mood and affect. NECK: Kevin cervical lymphadenopathy, thyroid normal.      Kevin JONELLE Coombs, PA-C 10:19 AM

## 2024-05-27 ENCOUNTER — Ambulatory Visit: Payer: Self-pay | Admitting: Physician Assistant

## 2024-05-27 ENCOUNTER — Other Ambulatory Visit (INDEPENDENT_AMBULATORY_CARE_PROVIDER_SITE_OTHER)

## 2024-05-27 ENCOUNTER — Encounter: Payer: Self-pay | Admitting: Physician Assistant

## 2024-05-27 DIAGNOSIS — R1032 Left lower quadrant pain: Secondary | ICD-10-CM | POA: Diagnosis not present

## 2024-05-27 DIAGNOSIS — R1310 Dysphagia, unspecified: Secondary | ICD-10-CM

## 2024-05-27 DIAGNOSIS — R490 Dysphonia: Secondary | ICD-10-CM

## 2024-05-27 DIAGNOSIS — K5732 Diverticulitis of large intestine without perforation or abscess without bleeding: Secondary | ICD-10-CM | POA: Diagnosis not present

## 2024-05-27 DIAGNOSIS — R1319 Other dysphagia: Secondary | ICD-10-CM

## 2024-05-27 DIAGNOSIS — D72829 Elevated white blood cell count, unspecified: Secondary | ICD-10-CM

## 2024-05-27 DIAGNOSIS — F1090 Alcohol use, unspecified, uncomplicated: Secondary | ICD-10-CM

## 2024-05-27 LAB — CBC WITH DIFFERENTIAL/PLATELET
Basophils Absolute: 0 K/uL (ref 0.0–0.1)
Basophils Relative: 0.4 % (ref 0.0–3.0)
Eosinophils Absolute: 0.2 K/uL (ref 0.0–0.7)
Eosinophils Relative: 4.3 % (ref 0.0–5.0)
HCT: 42.6 % (ref 39.0–52.0)
Hemoglobin: 14.5 g/dL (ref 13.0–17.0)
Lymphocytes Relative: 41.1 % (ref 12.0–46.0)
Lymphs Abs: 2.2 K/uL (ref 0.7–4.0)
MCHC: 34.1 g/dL (ref 30.0–36.0)
MCV: 90.6 fl (ref 78.0–100.0)
Monocytes Absolute: 0.6 K/uL (ref 0.1–1.0)
Monocytes Relative: 10.5 % (ref 3.0–12.0)
Neutro Abs: 2.3 K/uL (ref 1.4–7.7)
Neutrophils Relative %: 43.7 % (ref 43.0–77.0)
Platelets: 316 K/uL (ref 150.0–400.0)
RBC: 4.7 Mil/uL (ref 4.22–5.81)
RDW: 13.5 % (ref 11.5–15.5)
WBC: 5.3 K/uL (ref 4.0–10.5)

## 2024-05-27 LAB — COMPREHENSIVE METABOLIC PANEL WITH GFR
ALT: 18 U/L (ref 0–53)
AST: 17 U/L (ref 0–37)
Albumin: 4.5 g/dL (ref 3.5–5.2)
Alkaline Phosphatase: 58 U/L (ref 39–117)
BUN: 13 mg/dL (ref 6–23)
CO2: 27 meq/L (ref 19–32)
Calcium: 9.1 mg/dL (ref 8.4–10.5)
Chloride: 102 meq/L (ref 96–112)
Creatinine, Ser: 1.06 mg/dL (ref 0.40–1.50)
GFR: 85.73 mL/min (ref >60.00)
Glucose, Bld: 109 mg/dL — ABNORMAL HIGH (ref 70–99)
Potassium: 4.3 meq/L (ref 3.5–5.1)
Sodium: 136 meq/L (ref 135–145)
Total Bilirubin: 0.9 mg/dL (ref 0.2–1.2)
Total Protein: 7 g/dL (ref 6.0–8.3)

## 2024-05-27 LAB — C-REACTIVE PROTEIN: CRP: <1 mg/dL (ref 0.5–20.0)

## 2024-05-27 LAB — SEDIMENTATION RATE: Sed Rate: 4 mm/h (ref 0–15)

## 2024-05-27 MED ORDER — NA SULFATE-K SULFATE-MG SULF 17.5-3.13-1.6 GM/177ML PO SOLN: 1.0000 | ORAL | 0 refills | Status: DC

## 2024-05-27 MED ORDER — FAMOTIDINE 40 MG PO TABS: 40.0000 mg | ORAL_TABLET | Freq: Every day | ORAL | 0 refills | Status: AC

## 2024-05-27 NOTE — Patient Instructions (Addendum)
 _______________________________________________________  If your blood pressure at your visit was 140/90 or greater, please contact your primary care physician to follow up on this.  _______________________________________________________  If you are age 44 or older, your body mass index should be between 23-30. Your Body mass index is 31.73 kg/m. If this is out of the aforementioned range listed, please consider follow up with your Primary Care Provider.  If you are age 4 or younger, your body mass index should be between 19-25. Your Body mass index is 31.73 kg/m. If this is out of the aformentioned range listed, please consider follow up with your Primary Care Provider.   ________________________________________________________  The [facility] providers would like to encourage you to use MYCHART to communicate with providers for non-urgent requests or questions.  Due to long hold times on the telephone, sending your provider a message by [facility] may be a faster and more efficient way to get a response.  Please allow 48 business hours for a response.  Please remember that this is for non-urgent requests.  _______________________________________________________  [facility] is using a team-based approach to care.  Your team is made up of your doctor and two to three APPS. Our APPS (Nurse Practitioners and Physician Assistants) work with your physician to ensure care continuity for you. They are fully qualified to address your health concerns and develop a treatment plan. They communicate directly with your gastroenterologist to care for you. Seeing the Advanced Practice Practitioners on your physician's team can help you by facilitating care more promptly, often allowing for earlier appointments, access to diagnostic testing, procedures, and other specialty referrals.   Your provider has requested that you go to the basement level for lab work before leaving today. Press B on the  elevator. The lab is located at the first door on the left as you exit the elevator.  You have been scheduled for an endoscopy and colonoscopy. Please follow the written instructions given to you at your visit today.  If you use inhalers (even only as needed), please bring them with you on the day of your procedure.  DO NOT TAKE 7 DAYS PRIOR TO TEST- Trulicity (dulaglutide) Ozempic, Wegovy (semaglutide) Mounjaro (tirzepatide) Bydureon Bcise (exanatide extended release)  DO NOT TAKE 1 DAY PRIOR TO YOUR TEST Rybelsus (semaglutide) Adlyxin (lixisenatide) Victoza (liraglutide) Byetta (exanatide) ___________________________________________________________________________  Due to recent changes in healthcare laws, you may see the results of your imaging and laboratory studies on MyChart before your provider has had a chance to review them.  We understand that in some cases there may be results that are confusing or concerning to you. Not all laboratory results come back in the same time frame and the provider may be waiting for multiple results in order to interpret others.  Please give us  48 hours in order for your provider to thoroughly review all the results before contacting the office for clarification of your results.   Silent reflux: Not all heartburn burns...SABRASABRASABRA  What is LPR? Laryngopharyngeal reflux (LPR) or silent reflux is a condition in which acid that is made in the stomach travels up the esophagus (swallowing tube) and gets to the throat. Not everyone with reflux has a lot of heartburn or indigestion. In fact, many people with LPR never have heartburn. This is why LPR is called SILENT REFLUX, and the terms Silent reflux and LPR are often used interchangeably. Because LPR is silent, it is sometimes difficult to diagnose.  How can you tell if you have LPR?  Chronic hoarseness- Some people have hoarseness that comes and goes throat clearing  Cough It can cause shortness of  breath and cause asthma like symptoms. a feeling of a lump in the throat  difficulty swallowing a problem with too much nose and throat drainage.  Some people will feel their esophagus spasm which feels like their heart beating hard and fast, this will usually be after a meal, at rest, or lying down at night.    How do I treat this? Treatment for LPR should be individualized, and your doctor will suggest the best treatment for you. Generally there are several treatments for LPR: changing habits and diet to reduce reflux,  medications to reduce stomach acid, and  surgery to prevent reflux. Most people with LPR need to modify how and when they eat, as well as take some medication, to get well. Sometimes, nonprescription liquid antacids, such as Maalox, Gelucil and Mylanta are recommended. When used, these antacids should be taken four times each day - one tablespoon one hour after each meal and before bedtime. Dietary and lifestyle changes alone are not often enough to control LPR - medications that reduce stomach acid are also usually needed. These must be prescribed by our doctor.   TIPS FOR REDUCING REFLUX AND LPR Control your LIFE-STYLE and your DIET! If you use tobacco, QUIT.  Smoking makes you reflux. After every cigarette you have some LPR.  Don't wear clothing that is too tight, especially around the waist (trousers, corsets, belts).  Do not lie down just after eating...in fact, do not eat within three hours of bedtime.  You should be on a low-fat diet.  Limit your intake of red meat.  Limit your intake of butter.  Avoid fried foods.  Avoid chocolate  Avoid cheese.  Avoid eggs. Specifically avoid caffeine (especially coffee and tea), soda pop (especially cola) and mints.  Avoid alcoholic beverages, particularly in the evening.   Diverticulosis Diverticulosis is a condition that develops when small pouches (diverticula) form in the wall of the large intestine (colon). The  colon is where water is absorbed and stool (feces) is formed. The pouches form when the inside layer of the colon pushes through weak spots in the outer layers of the colon. You may have a few pouches or many of them. The pouches usually do not cause problems unless they become inflamed or infected. When this happens, the condition is called diverticulitis- this is left lower quadrant pain, diarrhea, fever, chills, nausea or vomiting.  If this occurs please call the office or go to the hospital. Sometimes these patches without inflammation can also have painless bleeding associated with them, if this happens please call the office or go to the hospital. Preventing constipation and increasing fiber can help reduce diverticula and prevent complications. Even if you feel you have a high-fiber diet, suggest getting on Benefiber or Cirtracel 2 times daily.   During an Acute Flare-Up (Clear Liquid to Low-Fiber Diet) The goal is to reduce irritation and let your colon rest.  Day 1-3: Clear Liquid Diet Water  Broth (chicken, beef, or vegetable)  Clear juices (apple, white grape - avoid citrus)  Ice pops without pulp or seeds  Gelatin (no fruit or seeds)  Tea or coffee (no cream or dairy)  After Symptoms Improve: Low-Fiber Diet Gradually transition to low-fiber foods for easier digestion.  Sample Foods White rice, pasta, or plain white bread  Cooked or canned vegetables without skins or seeds (e.g., carrots, green beans, potatoes)  Eggs, fish, or poultry  Low-fiber cereals (like cornflakes)  Dairy (if tolerated)  Ripe bananas, melon, or canned fruit without seeds  Long-Term Maintenance: High-Fiber Diet (Once Fully Recovered) This helps prevent future flare-ups by keeping the bowel movements soft and regular.  High-Fiber Foods Fruits: Apples (peeled), pears, berries, prunes  Vegetables: Broccoli, spinach, zucchini, peas  Whole grains: Oatmeal, brown rice, quinoa, whole wheat  bread  Legumes: Lentils, chickpeas, black beans (start slowly to avoid gas)  Nuts & seeds: Only if tolerated (research no longer restricts them, but if you feel they cause a flare do not eat them)  Thank you for entrusting me with your care and choosing [facility].  Alan Coombs, PA-C

## 2024-05-28 LAB — TISSUE TRANSGLUTAMINASE, IGA: (tTG) Ab, IgA: <1 U/mL

## 2024-05-28 LAB — IGA: Immunoglobulin A: 227 mg/dL (ref 47–310)

## 2024-06-03 NOTE — Progress Notes (Signed)
 Agree with the assessment and plan as outlined by Quentin Mulling, PA-C. ? ?Keron Neenan, DO, FACG ? ?

## 2024-06-28 ENCOUNTER — Encounter: Payer: Self-pay | Admitting: Gastroenterology

## 2024-07-05 ENCOUNTER — Encounter: Payer: Self-pay | Admitting: Gastroenterology

## 2024-07-05 ENCOUNTER — Ambulatory Visit: Admitting: Gastroenterology

## 2024-07-05 VITALS — BP 124/85 | HR 64 | Temp 98.3°F | Resp 11 | Ht 74.0 in | Wt 247.0 lb

## 2024-07-05 DIAGNOSIS — K2289 Other specified disease of esophagus: Secondary | ICD-10-CM | POA: Diagnosis not present

## 2024-07-05 DIAGNOSIS — K641 Second degree hemorrhoids: Secondary | ICD-10-CM

## 2024-07-05 DIAGNOSIS — K222 Esophageal obstruction: Secondary | ICD-10-CM

## 2024-07-05 DIAGNOSIS — K648 Other hemorrhoids: Secondary | ICD-10-CM

## 2024-07-05 DIAGNOSIS — R1310 Dysphagia, unspecified: Secondary | ICD-10-CM

## 2024-07-05 DIAGNOSIS — K5730 Diverticulosis of large intestine without perforation or abscess without bleeding: Secondary | ICD-10-CM

## 2024-07-05 DIAGNOSIS — D128 Benign neoplasm of rectum: Secondary | ICD-10-CM

## 2024-07-05 DIAGNOSIS — K5732 Diverticulitis of large intestine without perforation or abscess without bleeding: Secondary | ICD-10-CM

## 2024-07-05 DIAGNOSIS — R1319 Other dysphagia: Secondary | ICD-10-CM

## 2024-07-05 MED ORDER — SODIUM CHLORIDE 0.9 % IV SOLN: 500.0000 mL | Freq: Once | INTRAVENOUS | Status: DC

## 2024-07-05 NOTE — Progress Notes (Signed)
To pacu VSS. Report to [email or web address] 

## 2024-07-05 NOTE — Op Note (Signed)
 [facility] Patient Name: [facility] Procedure Date: 07/05/2024 2:03 PM MRN: [id number] Endoscopist: Sandor Flatter , MD, [id number] Age: 42 Referring MD:  Date of Birth: 07-13-1981 Gender: Male Account #: [id number] Procedure:                Upper GI endoscopy Indications:              Dysphagia Medicines:                Monitored Anesthesia Care Procedure:                Pre-Anesthesia Assessment:                           - Prior to the procedure, a History and Physical                            was performed, and patient medications and                            allergies were reviewed. The patient's tolerance of                            previous anesthesia was also reviewed. The risks                            and benefits of the procedure and the sedation                            options and risks were discussed with the patient.                            All questions were answered, and informed consent                            was obtained. Prior Anticoagulants: The patient has                            taken no anticoagulant or antiplatelet agents. ASA                            Grade Assessment: II - A patient with mild systemic                            disease. After reviewing the risks and benefits,                            the patient was deemed in satisfactory condition to                            undergo the procedure.                           After obtaining informed consent, the endoscope was  passed under direct vision. Throughout the                            procedure, the patient's blood pressure, pulse, and                            oxygen saturations were monitored continuously. The                            GIF [id number] #[id number] was introduced through the                            mouth, and advanced to the second part of duodenum.                            The upper GI endoscopy was accomplished  without                            difficulty. The patient tolerated the procedure                            well. Scope In: Scope Out: Findings:                 Two benign-appearing, intrinsic moderate stenoses                            were found 41 to 43 cm from the incisors. The                            stenoses were traversed. A TTS dilator was passed                            through the scope. Dilation with a 15-16.5-18 mm                            balloon dilator was performed to 16.5 mm. The                            dilation site was examined and showed mild mucosal                            disruptions and moderate improvement in luminal                            narrowing, all consistent with appropriate                            endoscopic dilation. Estimated blood loss was                            minimal. Biopsies were taken with a cold forceps  for dual purposes of further fracturing of the                            strictures and sent for histology. Estimated blood                            loss was minimal.                           The Z-line was regular and was found 44 cm from the                            incisors.                           The entire examined stomach was normal.                           The examined duodenum was normal. Complications:            No immediate complications. Estimated Blood Loss:     Estimated blood loss was minimal. Impression:               - Benign-appearing esophageal stenoses. Dilated                            with a 16.5 mm TTS balloon with appropriate mucosal                            disruptions. These were then biopsied for further                            fracturing of the strictures and histologic                            evaluation.                           - Z-line regular, 44 cm from the incisors.                           - Normal stomach.                           -  Normal examined duodenum. Recommendation:           - Patient has a contact number available for                            emergencies. The signs and symptoms of potential                            delayed complications were discussed with the                            patient. Return to normal activities tomorrow.  Written discharge instructions were provided to the                            patient.                           - Resume previous diet.                           - Continue present medications.                           - Await pathology results.                           - Repeat upper endoscopy PRN for retreatment. Sandor Flatter, MD 07/05/2024 2:47:04 PM

## 2024-07-05 NOTE — Patient Instructions (Addendum)
 Resume previous diet. Continue present medications. Awaiting pathology results. Repeat upper endoscopy as needed for treatment.  Repeat colonoscopy for surveillance based on pathology results. Handouts provided on esophageal stricture, polyps, diverticulosis, and hemorrhoids.  Return to GI office as needed.  YOU HAD AN ENDOSCOPIC PROCEDURE TODAY AT THE [facility]:   Refer to the procedure report that was given to you for any specific questions about what was found during the examination.  If the procedure report does not answer your questions, please call your gastroenterologist to clarify.  If you requested that your care partner not be given the details of your procedure findings, then the procedure report has been included in a sealed envelope for you to review at your convenience later.  YOU SHOULD EXPECT: Some feelings of bloating in the abdomen. Passage of more gas than usual.  Walking can help get rid of the air that was put into your GI tract during the procedure and reduce the bloating. If you had a lower endoscopy (such as a colonoscopy or flexible sigmoidoscopy) you may notice spotting of blood in your stool or on the toilet paper. If you underwent a bowel prep for your procedure, you may not have a normal bowel movement for a few days.  Please Note:  You might notice some irritation and congestion in your nose or some drainage.  This is from the oxygen used during your procedure.  There is no need for concern and it should clear up in a day or so.  SYMPTOMS TO REPORT IMMEDIATELY:  Following lower endoscopy (colonoscopy or flexible sigmoidoscopy):  Excessive amounts of blood in the stool  Significant tenderness or worsening of abdominal pains  Swelling of the abdomen that is new, acute  Fever of 100F or higher  Following upper endoscopy (EGD)  Vomiting of blood or coffee ground material  New chest pain or pain under the shoulder blades  Painful or persistently  difficult swallowing  New shortness of breath  Fever of 100F or higher  Black, tarry-looking stools  For urgent or emergent issues, a gastroenterologist can be reached at any hour by calling [phone]. Do not use MyChart messaging for urgent concerns.    DIET:  We do recommend a small meal at first, but then you may proceed to your regular diet.  Drink plenty of fluids but you should avoid alcoholic beverages for 24 hours.  ACTIVITY:  You should plan to take it easy for the rest of today and you should NOT DRIVE or use heavy machinery until tomorrow (because of the sedation medicines used during the test).    FOLLOW UP: Our staff will call the number listed on your records the next business day following your procedure.  We will call around 7:15- 8:00 am to check on you and address any questions or concerns that you may have regarding the information given to you following your procedure. If we do not reach you, we will leave a message.     If any biopsies were taken you will be contacted by phone or by letter within the next 1-3 weeks.  Please call us  at [phone] if you have not heard about the biopsies in 3 weeks.    SIGNATURES/CONFIDENTIALITY: You and/or your care partner have signed paperwork which will be entered into your electronic medical record.  These signatures attest to the fact that that the information above on your After Visit Summary has been reviewed and is understood.  Full responsibility of the  confidentiality of this discharge information lies with you and/or your care-partner.

## 2024-07-05 NOTE — Progress Notes (Signed)
 GASTROENTEROLOGY PROCEDURE H&P NOTE   Primary Care Physician: Patient, No Pcp Per    Reason for Procedure:  Dysphagia, diverticulitis follow-up  Plan:    EGD, colonoscopy  Patient is appropriate for endoscopic procedure(s) in the ambulatory ([facility]) setting.  The nature of the procedure, as well as the risks, benefits, and alternatives were carefully and thoroughly reviewed with the patient. Ample time for discussion and questions allowed. The patient understood, was satisfied, and agreed to proceed.     HPI: Kevin Malone is a 43 y.o. male who presents for EGD for evaluation of a long history of intermittent solid food dysphagia, along with colonoscopy for follow-up of recent diverticulitis.  Otherwise, no significant changes in clinical history since last office appointment on 05/27/2024.  Labs from that day reviewed and notable for normal CBC, CMP, TTG, IgA, ESR, CRP.  Past Medical History:  Diagnosis Date   Allergic reaction    alpha-gal   Asthma    as a child    Past Surgical History:  Procedure Laterality Date   COLONOSCOPY     SHOULDER SURGERY Left    UPPER GASTROINTESTINAL ENDOSCOPY     WRIST SURGERY Right     Prior to Admission medications   Medication Sig Start Date End Date Taking? Authorizing Provider  famotidine  (PEPCID ) 40 MG tablet Take 1 tablet (40 mg total) by mouth at bedtime. 05/27/24  Yes Craig Alan SAUNDERS, PA-C  AUVI-Q  0.3 MG/0.3ML SOAJ injection Use as directed for life threatening allergic reactions Patient not taking: Reported on 07/05/2024 12/25/17   Kozlow, Eric J, MD    Current Outpatient Medications  Medication Sig Dispense Refill   famotidine  (PEPCID ) 40 MG tablet Take 1 tablet (40 mg total) by mouth at bedtime. 30 tablet 0   AUVI-Q  0.3 MG/0.3ML SOAJ injection Use as directed for life threatening allergic reactions (Patient not taking: Reported on 07/05/2024) 2 Device 3   Current Facility-Administered Medications  Medication Dose Route  Frequency Provider Last Rate Last Admin   0.9 %  sodium chloride  infusion  500 mL Intravenous Once Bexley Laubach V, DO        Allergies as of 07/05/2024 - Review Complete 07/05/2024  Allergen Reaction Noted   Alpha-d-galactosidase Other (See Comments) 08/29/2021    Family History  Problem Relation Age of Onset   Hypertension Mother    Hypertension Father    Colon cancer Neg Hx    Esophageal cancer Neg Hx    Rectal cancer Neg Hx    Stomach cancer Neg Hx     Social History   Socioeconomic History   Marital status: Married    Spouse name: Not on file   Number of children: 2   Years of education: Not on file   Highest education level: Not on file  Occupational History   Not on file  Tobacco Use   Smoking status: Former   Smokeless tobacco: Current    Types: Chew  Vaping Use   Vaping status: Some Days   Substances: Nicotine  Substance and Sexual Activity   Alcohol use: Yes    Comment: Occasionally   Drug use: Not Currently   Sexual activity: Not on file  Other Topics Concern   Not on file  Social History Narrative   Not on file   Social Drivers of Health   Financial Resource Strain: Not on file  Food Insecurity: Not on file  Transportation Needs: Not on file  Physical Activity: Not on file  Stress: Not  on file  Social Connections: Not on file  Intimate Partner Violence: Not on file    Physical Exam: Vital signs in last 24 hours: @BP  112/76   Pulse 74   Temp 98.3 F (36.8 C) (Temporal)   Ht 6' 2 (1.88 m)   Wt 247 lb (112 kg)   SpO2 97%   BMI 31.71 kg/m  GEN: NAD EYE: Sclerae anicteric ENT: MMM CV: Non-tachycardic Pulm: CTA b/l GI: Soft, NT/ND NEURO:  Alert & Oriented x 3   Sandor Flatter, DO [facility]   07/05/2024 2:01 PM

## 2024-07-05 NOTE — Progress Notes (Signed)
 Called to room to assist during endoscopic procedure.  Patient ID and intended procedure confirmed with present staff. Received instructions for my participation in the procedure from the performing physician.

## 2024-07-05 NOTE — Progress Notes (Signed)
 Pt's states no medical or surgical changes since previsit or office visit.

## 2024-07-05 NOTE — Op Note (Signed)
 [facility] Patient Name: [facility] Procedure Date: 07/05/2024 1:58 PM MRN: [id number] Endoscopist: Sandor Flatter , MD, [id number] Age: 43 Referring MD:  Date of Birth: [birth year] Gender: Male Account #: [id number] Procedure:                Colonoscopy Indications:              Follow-up of diverticulitis Medicines:                Monitored Anesthesia Care Procedure:                Pre-Anesthesia Assessment:                           - Prior to the procedure, a History and Physical                            was performed, and patient medications and                            allergies were reviewed. The patient's tolerance of                            previous anesthesia was also reviewed. The risks                            and benefits of the procedure and the sedation                            options and risks were discussed with the patient.                            All questions were answered, and informed consent                            was obtained. Prior Anticoagulants: The patient has                            taken no anticoagulant or antiplatelet agents. ASA                            Grade Assessment: II - A patient with mild systemic                            disease. After reviewing the risks and benefits,                            the patient was deemed in satisfactory condition to                            undergo the procedure.                           After obtaining informed consent, the colonoscope  was passed under direct vision. Throughout the                            procedure, the patient's blood pressure, pulse, and                            oxygen saturations were monitored continuously. The                            Olympus Scope SN: [id number] was introduced through                            the anus and advanced to the the terminal ileum.                            The colonoscopy was performed  without difficulty.                            The patient tolerated the procedure well. The                            quality of the bowel preparation was good. The                            terminal ileum, ileocecal valve, appendiceal                            orifice, and rectum were photographed. Scope In: 2:21:32 PM Scope Out: 2:40:32 PM Scope Withdrawal Time: 0 hours 14 minutes 24 seconds  Total Procedure Duration: 0 hours 19 minutes 0 seconds  Findings:                 The perianal and digital rectal examinations were                            normal.                           A 3 mm polyp was found in the rectum. The polyp was                            sessile. The polyp was removed with a cold snare.                            Resection and retrieval were complete. Estimated                            blood loss was minimal.                           Multiple large-mouthed and small-mouthed                            diverticula were found in the sigmoid colon,  descending colon and transverse colon.                           Non-bleeding internal hemorrhoids were found during                            retroflexion. The hemorrhoids were small.                           The terminal ileum appeared normal. Complications:            No immediate complications. Estimated Blood Loss:     Estimated blood loss was minimal. Impression:               - One 3 mm polyp in the rectum, removed with a cold                            snare. Resected and retrieved.                           - Diverticulosis in the sigmoid colon, in the                            descending colon and in the transverse colon.                           - Non-bleeding internal hemorrhoids.                           - The examined portion of the ileum was normal. Recommendation:           - Patient has a contact number available for                            emergencies. The signs  and symptoms of potential                            delayed complications were discussed with the                            patient. Return to normal activities tomorrow.                            Written discharge instructions were provided to the                            patient.                           - Resume previous diet.                           - Continue present medications.                           - Await pathology results.                           -  Repeat colonoscopy for surveillance based on                            pathology results.                           - Return to GI office PRN. Sandor Flatter, MD 07/05/2024 2:50:28 PM

## 2024-07-06 ENCOUNTER — Telehealth: Payer: Self-pay

## 2024-07-06 NOTE — Telephone Encounter (Signed)
  Follow up Call-     07/05/2024    1:12 PM  Call back number  Post procedure Call Back phone  # [phone]  Permission to leave phone message Yes     Patient questions:  Do you have a fever, pain , or abdominal swelling? No. Pain Score  0 *  Have you tolerated food without any problems? Yes.    Have you been able to return to your normal activities? Yes.    Do you have any questions about your discharge instructions: Diet   No. Medications  No. Follow up visit  No.  Do you have questions or concerns about your Care? No.  Actions: * If pain score is 4 or above: No action needed, pain <4.

## 2024-07-08 LAB — SURGICAL PATHOLOGY

## 2024-07-09 ENCOUNTER — Ambulatory Visit: Payer: Self-pay | Admitting: Gastroenterology
